# Patient Record
Sex: Female | Born: 1985 | Race: Black or African American | Hispanic: No | Marital: Single | Smoking: Never smoker
Health system: Southern US, Community
[De-identification: ages and names within clinical notes are randomized; demographics above are authoritative.]

## PROBLEM LIST (undated history)

## (undated) DIAGNOSIS — M419 Scoliosis, unspecified: Secondary | ICD-10-CM

## (undated) DIAGNOSIS — F419 Anxiety disorder, unspecified: Secondary | ICD-10-CM

## (undated) DIAGNOSIS — G43909 Migraine, unspecified, not intractable, without status migrainosus: Secondary | ICD-10-CM

## (undated) DIAGNOSIS — I471 Supraventricular tachycardia, unspecified: Secondary | ICD-10-CM

## (undated) HISTORY — DX: Migraine, unspecified, not intractable, without status migrainosus: G43.909

## (undated) HISTORY — DX: Scoliosis, unspecified: M41.9

---

## 2008-12-14 ENCOUNTER — Emergency Department (HOSPITAL_COMMUNITY): Admission: EM | Admit: 2008-12-14 | Discharge: 2008-12-14 | Payer: Self-pay | Admitting: Emergency Medicine

## 2014-01-02 ENCOUNTER — Encounter (HOSPITAL_BASED_OUTPATIENT_CLINIC_OR_DEPARTMENT_OTHER): Payer: Self-pay | Admitting: Emergency Medicine

## 2014-01-02 ENCOUNTER — Emergency Department (HOSPITAL_BASED_OUTPATIENT_CLINIC_OR_DEPARTMENT_OTHER)
Admission: EM | Admit: 2014-01-02 | Discharge: 2014-01-02 | Disposition: A | Discharge status: Home / Self Care | Payer: BC Managed Care – PPO | Attending: Emergency Medicine | Admitting: Emergency Medicine

## 2014-01-02 ENCOUNTER — Emergency Department (HOSPITAL_BASED_OUTPATIENT_CLINIC_OR_DEPARTMENT_OTHER): Payer: BC Managed Care – PPO

## 2014-01-02 DIAGNOSIS — F419 Anxiety disorder, unspecified: Secondary | ICD-10-CM

## 2014-01-02 DIAGNOSIS — F411 Generalized anxiety disorder: Secondary | ICD-10-CM | POA: Insufficient documentation

## 2014-01-02 DIAGNOSIS — K2970 Gastritis, unspecified, without bleeding: Secondary | ICD-10-CM

## 2014-01-02 DIAGNOSIS — K2990 Gastroduodenitis, unspecified, without bleeding: Principal | ICD-10-CM

## 2014-01-02 DIAGNOSIS — Z8659 Personal history of other mental and behavioral disorders: Secondary | ICD-10-CM | POA: Insufficient documentation

## 2014-01-02 DIAGNOSIS — Z79899 Other long term (current) drug therapy: Secondary | ICD-10-CM | POA: Insufficient documentation

## 2014-01-02 DIAGNOSIS — Z3202 Encounter for pregnancy test, result negative: Secondary | ICD-10-CM | POA: Insufficient documentation

## 2014-01-02 HISTORY — DX: Supraventricular tachycardia, unspecified: I47.10

## 2014-01-02 HISTORY — DX: Supraventricular tachycardia: I47.1

## 2014-01-02 HISTORY — DX: Anxiety disorder, unspecified: F41.9

## 2014-01-02 LAB — COMPREHENSIVE METABOLIC PANEL
ALBUMIN: 3.9 g/dL (ref 3.5–5.2)
ALT: 9 U/L (ref 0–35)
AST: 20 U/L (ref 0–37)
Alkaline Phosphatase: 50 U/L (ref 39–117)
BILIRUBIN TOTAL: 0.3 mg/dL (ref 0.3–1.2)
BUN: 9 mg/dL (ref 6–23)
CO2: 21 mEq/L (ref 19–32)
CREATININE: 0.8 mg/dL (ref 0.50–1.10)
Calcium: 9 mg/dL (ref 8.4–10.5)
Chloride: 103 mEq/L (ref 96–112)
GFR calc Af Amer: >90 mL/min (ref >90)
GFR calc non Af Amer: >90 mL/min (ref >90)
Glucose, Bld: 109 mg/dL — ABNORMAL HIGH (ref 70–99)
Potassium: 3.3 mEq/L — ABNORMAL LOW (ref 3.7–5.3)
Sodium: 139 mEq/L (ref 137–147)
Total Protein: 7.7 g/dL (ref 6.0–8.3)

## 2014-01-02 LAB — CBC WITH DIFFERENTIAL/PLATELET
BASOS ABS: 0 10*3/uL (ref 0.0–0.1)
BASOS PCT: 0 % (ref 0–1)
EOS ABS: 0 10*3/uL (ref 0.0–0.7)
EOS PCT: 1 % (ref 0–5)
HEMATOCRIT: 34.5 % — AB (ref 36.0–46.0)
Hemoglobin: 11.3 g/dL — ABNORMAL LOW (ref 12.0–15.0)
LYMPHS PCT: 32 % (ref 12–46)
Lymphs Abs: 1.9 10*3/uL (ref 0.7–4.0)
MCH: 25.3 pg — ABNORMAL LOW (ref 26.0–34.0)
MCHC: 32.8 g/dL (ref 30.0–36.0)
MCV: 77.4 fL — AB (ref 78.0–100.0)
MONO ABS: 0.6 10*3/uL (ref 0.1–1.0)
Monocytes Relative: 9 % (ref 3–12)
Neutro Abs: 3.4 10*3/uL (ref 1.7–7.7)
Neutrophils Relative %: 58 % (ref 43–77)
PLATELETS: 290 10*3/uL (ref 150–400)
RBC: 4.46 MIL/uL (ref 3.87–5.11)
RDW: 16 % — AB (ref 11.5–15.5)
WBC: 5.9 10*3/uL (ref 4.0–10.5)

## 2014-01-02 LAB — LIPASE, BLOOD: LIPASE: 70 U/L — AB (ref 11–59)

## 2014-01-02 LAB — PREGNANCY, URINE: Preg Test, Ur: NEGATIVE

## 2014-01-02 LAB — D-DIMER, QUANTITATIVE (NOT AT ARMC): D-Dimer, Quant: <0.27 ug/mL-FEU (ref 0.00–0.48)

## 2014-01-02 MED ORDER — GI COCKTAIL ~~LOC~~
30.0000 mL | Freq: Once | ORAL | Status: AC
  Administered 2014-01-02: 30 mL via ORAL
  Filled 2014-01-02: qty 30

## 2014-01-02 MED ORDER — SUCRALFATE 1 GM/10ML PO SUSP: 1.0000 g | Freq: Three times a day (TID) | ORAL | Status: AC

## 2014-01-02 NOTE — ED Notes (Signed)
Pt advised of need for urine specimen collection and verbalized understanding

## 2014-01-02 NOTE — Discharge Instructions (Signed)

## 2014-01-02 NOTE — ED Notes (Signed)
Pt reports light head since Thursday. Start of chest tightness, left arm pain and return of light headedness Saturday, onset of SOB this AM .

## 2014-01-02 NOTE — ED Provider Notes (Signed)
CSN: [id number]     Arrival date & time 01/02/14  0256 History   First MD Initiated Contact with Patient 01/02/14 0315     Chief Complaint  Patient presents with  . Nausea    chest tiughtness and arm pain     (Consider location/radiation/quality/duration/timing/severity/associated sxs/prior Treatment) Patient is a 28 y.o. female presenting with abdominal pain. The history is provided by the patient.  Abdominal Pain Pain location:  Epigastric Pain quality: sharp   Pain radiates to:  Back Pain severity:  Moderate Onset quality:  Sudden Timing:  Constant Progression:  Unchanged Context: alcohol use and eating   Context comment:  Six glasses of wine a steak and ceasar salad Relieved by:  Nothing Worsened by:  Nothing tried Ineffective treatments:  None tried Associated symptoms: nausea   Associated symptoms: no anorexia, no fever and no vomiting   Risk factors: not pregnant   Ate a large meal with wine and then had pain.  Then became anxious about symptoms and things became worse and sought evaluation  Past Medical History  Diagnosis Date  . PSVT (paroxysmal supraventricular tachycardia)   . Anxiety    History reviewed. No pertinent past surgical history. History reviewed. No pertinent family history. History  Substance Use Topics  . Smoking status: Never Smoker   . Smokeless tobacco: Not on file  . Alcohol Use: Yes   OB History   Grav Para Term Preterm Abortions TAB SAB Ect Mult Living                 Review of Systems  Constitutional: Negative for fever.  Cardiovascular: Negative for palpitations and leg swelling.  Gastrointestinal: Positive for nausea and abdominal pain. Negative for vomiting and anorexia.  All other systems reviewed and are negative.      Allergies  Penicillins  Home Medications   Current Outpatient Rx  Name  Route  Sig  Dispense  Refill  . metoprolol tartrate (LOPRESSOR) 25 MG tablet   Oral   Take 12.5 mg by mouth 2 (two) times  daily.         . sucralfate (CARAFATE) 1 GM/10ML suspension   Oral   Take 10 mLs (1 g total) by mouth 4 (four) times daily -  with meals and at bedtime.   420 mL   0    BP 139/96  Pulse 108  Temp(Src) 98.3 F (36.8 C) (Oral)  Ht 5\' 4"  (1.626 m)  Wt 128 lb (58.06 kg)  BMI 21.96 kg/m2  SpO2 100% Physical Exam  Constitutional: She is oriented to person, place, and time. She appears well-developed and well-nourished. No distress.  HENT:  Head: Normocephalic and atraumatic.  Mouth/Throat: Oropharynx is clear and moist.  Eyes: Conjunctivae are normal. Pupils are equal, round, and reactive to light.  Neck: Normal range of motion. Neck supple.  Cardiovascular: Normal rate, regular rhythm and intact distal pulses.   Pulmonary/Chest: Effort normal and breath sounds normal. She has no wheezes. She has no rales.  Abdominal: Soft. Bowel sounds are normal. There is no tenderness. There is no rebound and no guarding.  Musculoskeletal: Normal range of motion. She exhibits no edema.  Neurological: She is alert and oriented to person, place, and time. She has normal reflexes.  Skin: Skin is warm and dry. She is not diaphoretic.  Psychiatric: Her mood appears anxious.    ED Course  Procedures (including critical care time) Labs Review Labs Reviewed  CBC WITH DIFFERENTIAL - Abnormal; Notable for the following:  Hemoglobin 11.3 (*)    HCT 34.5 (*)    MCV 77.4 (*)    MCH 25.3 (*)    RDW 16.0 (*)    All other components within normal limits  COMPREHENSIVE METABOLIC PANEL - Abnormal; Notable for the following:    Potassium 3.3 (*)    Glucose, Bld 109 (*)    All other components within normal limits  LIPASE, BLOOD - Abnormal; Notable for the following:    Lipase 70 (*)    All other components within normal limits  PREGNANCY, URINE  D-DIMER, QUANTITATIVE   Imaging Review Dg Chest 2 View  01/02/2014   CLINICAL DATA:  Nausea  EXAM: CHEST  2 VIEW  COMPARISON:  None.  FINDINGS: The  heart size and mediastinal contours are within normal limits. Both lungs are clear. The visualized skeletal structures are unremarkable.  IMPRESSION: No active cardiopulmonary disease.   Electronically Signed   By: Marlan Palauharles  Clark M.D.   On: 01/02/2014 04:45     EKG Interpretation   Date/Time:  Sunday January 02 2014 03:11:16 EDT Ventricular Rate:  104 PR Interval:  124 QRS Duration: 86 QT Interval:  330 QTC Calculation: 433 R Axis:   80 Text Interpretation:  Sinus tachycardia Confirmed by [facility]  MD,  Ayerim Berquist ([id number]) on 01/02/2014 3:17:52 AM      MDM   Final diagnoses:  Gastritis  Anxiety    Highly doubt cardiac etiology, DDimer is negative excluding blood clot in a very low risk patient.  The patient has a h/o of SVT and anxiety that she was on metoprolol for but she has not taken it recently.  Suspect gastritis and superimposed anxiety.  D/c alcohol and follow up with your doctor for ongoing care    Nao Linz K Allora Bains-Rasch, MD 01/02/14 [phone]

## 2015-10-02 ENCOUNTER — Encounter: Payer: Self-pay | Admitting: Allergy and Immunology

## 2015-10-02 ENCOUNTER — Ambulatory Visit (INDEPENDENT_AMBULATORY_CARE_PROVIDER_SITE_OTHER): Payer: BLUE CROSS/BLUE SHIELD | Admitting: Allergy and Immunology

## 2015-10-02 VITALS — BP 128/84 | HR 90 | Temp 98.4°F | Resp 20 | Ht 64.5 in | Wt 153.4 lb

## 2015-10-02 DIAGNOSIS — J3089 Other allergic rhinitis: Secondary | ICD-10-CM | POA: Diagnosis not present

## 2015-10-02 DIAGNOSIS — T783XXA Angioneurotic edema, initial encounter: Secondary | ICD-10-CM

## 2015-10-02 DIAGNOSIS — T7840XA Allergy, unspecified, initial encounter: Secondary | ICD-10-CM | POA: Diagnosis not present

## 2015-10-02 MED ORDER — EPINEPHRINE 0.3 MG/0.3ML IJ SOAJ: 0.3000 mg | Freq: Once | INTRAMUSCULAR | Status: AC

## 2015-10-02 NOTE — Assessment & Plan Note (Signed)
   Continue fexofenadine 180 mg daily as needed.  If symptoms persist or progress, we will evaluate with aero allergen skin testing and make further recommendations based upon those results.

## 2015-10-02 NOTE — Patient Instructions (Signed)
Allergic reactions The patient's history suggests allergic reaction to herbal ingredients in the supplements she had taken, though there are no specific constituents which seem to be commented both supplements.  Should symptoms recur, a journal is to be kept recording any foods eaten, beverages consumed, and medications taken within a 6 hour time period prior to the onset of symptoms, as well as record activities being performed, and environmental conditions. For any symptoms concerning for anaphylaxis, epinephrine is to be administered and 911 is to be called immediately.  I have recommended using FDA approved prescribed medications rather than herbal supplements for now.  If symptoms recur in the absence of an herbal supplement for other specific trigger, we will evaluate further with labs.  Other allergic rhinitis  Continue fexofenadine 180 mg daily as needed.  If symptoms persist or progress, we will evaluate with aero allergen skin testing and make further recommendations based upon those results.   Return in about 4 months (around 01/31/2016), or if symptoms worsen or fail to improve.

## 2015-10-02 NOTE — Assessment & Plan Note (Addendum)
The patient's history suggests allergic reaction to herbal supplements she had taken, though there are no specific constituents which are common to both supplements.  Should symptoms recur, a journal is to be kept recording any foods eaten, beverages consumed, and medications taken within a 6 hour time period prior to the onset of symptoms, as well as record activities being performed, and environmental conditions. For any symptoms concerning for anaphylaxis, epinephrine is to be administered and 911 is to be called immediately.  I have recommended using FDA approved medications prescribed by a licensed medical professional rather than herbal supplements for now.  If symptoms recur in the absence of an herbal supplement for other specific trigger, we will evaluate further with lab work.

## 2015-10-02 NOTE — Progress Notes (Signed)
NEW PATIENT NOTE  RE: Nicole Esparza MRN: [id number] DOB: 06-Apr-1986 [facility] [address] [place] [address] Date of Office Visit: 10/02/2015  Referring provider: No referring provider defined for this encounter.  Chief Complaint: Allergic Reaction; Food Allergy; Facial Swelling; Breathing Problem; and Allergic Rhinitis    History of present illness: HPI Comments: Nicole Esparza is a 29 y.o. female who presents today for her initial evaluation of allergic reactions. She reports that on April 27th 2016, she took her first dose of an over the counter weight loss herbal supplement. Within 2 hours she developed eyelid and facial angioedema. She was treated at the local urgent care with systemic steroids and her symptoms resolved within a few hours. She discontinued this supplement and had no recurrence of symptoms until May 9th, 2016. On May 9th, she took her first dose of an over the counter antihypertensive herbal supplement and within an hour she once again developed eyelid and facial angioedema. She did not experience concomitant cardiopulmonary or GI symptoms. No common ingredients were found in the 2 supplements.  She experiences nasal congestion, runny nose, and sneezing. These symptoms are most severe in the springtime.   Assessment and plan: No problem-specific assessment & plan notes found for this encounter.   Medications ordered this encounter: No orders of the defined types were placed in this encounter.    Diagnositics: Food allergen skin testing: Negative despite a positive histamine control.    Physical examination: Blood pressure 128/84, pulse 90, temperature 98.4 F (36.9 C), resp. rate 24, height 5' 4.5" (1.638 m), weight 153 lb 6.4 oz (69.582 kg).  General: Alert, interactive, in no acute distress. HEENT: TMs pearly gray, turbinates moderately edematous without discharge, post-pharynx mildly  erythematous. Neck: Supple without lymphadenopathy. Lungs: Clear to auscultation without wheezing, rhonchi or rales. CV: Normal S1, S2 without murmurs. Abdomen: Nondistended, nontender. Skin: Warm and dry, without lesions or rashes. Extremities:  No clubbing, cyanosis or edema. Neuro:   Grossly intact.  Review of systems: Review of Systems  Constitutional: Negative for fever, chills and weight loss.  HENT: Positive for congestion. Negative for nosebleeds.   Eyes: Negative for blurred vision.  Respiratory: Negative for hemoptysis, shortness of breath and wheezing.   Cardiovascular: Negative for chest pain.  Gastrointestinal: Negative for diarrhea and constipation.  Genitourinary: Negative for dysuria.  Musculoskeletal: Negative for myalgias and joint pain.  Neurological: Negative for dizziness.  Endo/Heme/Allergies: Does not bruise/bleed easily.    Past medical history: Past Medical History  Diagnosis Date  . PSVT (paroxysmal supraventricular tachycardia) (HCC)   . Anxiety   . PSVT (paroxysmal supraventricular tachycardia) (HCC) 29 years ago  . Migraines   . Scoliosis age 40    Past surgical history: History reviewed. No pertinent past surgical history.  Family history: Family History  Problem Relation Age of Onset  . Hypertension Mother   . Allergic rhinitis Mother   . Hypertension Father   . Allergic rhinitis Father   . Hypertension Sister   . Allergic rhinitis Sister   . Allergic rhinitis Brother   . Asthma Maternal Grandmother   . Asthma Paternal Grandmother     Social history: Social History   Social History  . Marital Status: Single    Spouse Name: N/A  . Number of Children: N/A  . Years of Education: N/A   Occupational History  . Not on file.   Social History Main Topics  .  Smoking status: Never Smoker   . Smokeless tobacco: Never Used  . Alcohol Use: Yes  . Drug Use: No  . Sexual Activity: Yes    Birth Control/ Protection: Pill   Other  Topics Concern  . Not on file   Social History Narrative   Environmental History:  Zella BallRobin lives in a 29 year old apartment with carpeting throughout and central air/heat.  She is a nonsmoker without pets.  Known medication allergies: Allergies  Allergen Reactions  . Penicillins Hives  . Sulfamethoxazole-Trimethoprim Hives  . Molds & Smuts Other (See Comments)    Sneezing, wheezing, feels like throat closing up    Outpatient medications:   Medication List       This list is accurate as of: 10/02/15  9:16 AM.  Always use your most recent med list.               ferrous sulfate 325 (65 FE) MG tablet  Take 325 mg by mouth daily as needed.     metoprolol tartrate 25 MG tablet  Commonly known as:  LOPRESSOR  Take 12.5 mg by mouth daily.     norgestimate-ethinyl estradiol 0.25-35 MG-MCG tablet  Commonly known as:  ORTHO-CYCLEN,SPRINTEC,PREVIFEM  Take 1 tablet by mouth daily.     sucralfate 1 GM/10ML suspension  Commonly known as:  CARAFATE  Take 10 mLs (1 g total) by mouth 4 (four) times daily -  with meals and at bedtime.     Vitamin D (Ergocalciferol) 50000 UNITS Caps capsule  Commonly known as:  DRISDOL  Take by mouth.        I appreciate the opportunity to take part in this Malan's care. Please do not hesitate to contact me with questions.  Sincerely,   R. Jorene Guestarter Manson [place]oo, MD

## 2015-10-05 IMAGING — CR DG CHEST 2V
2 series · 2 of 2 positions shown · non-contrast
Comparison: None.

CLINICAL DATA: Nausea

EXAM:
CHEST  2 VIEW

[w chest pa]
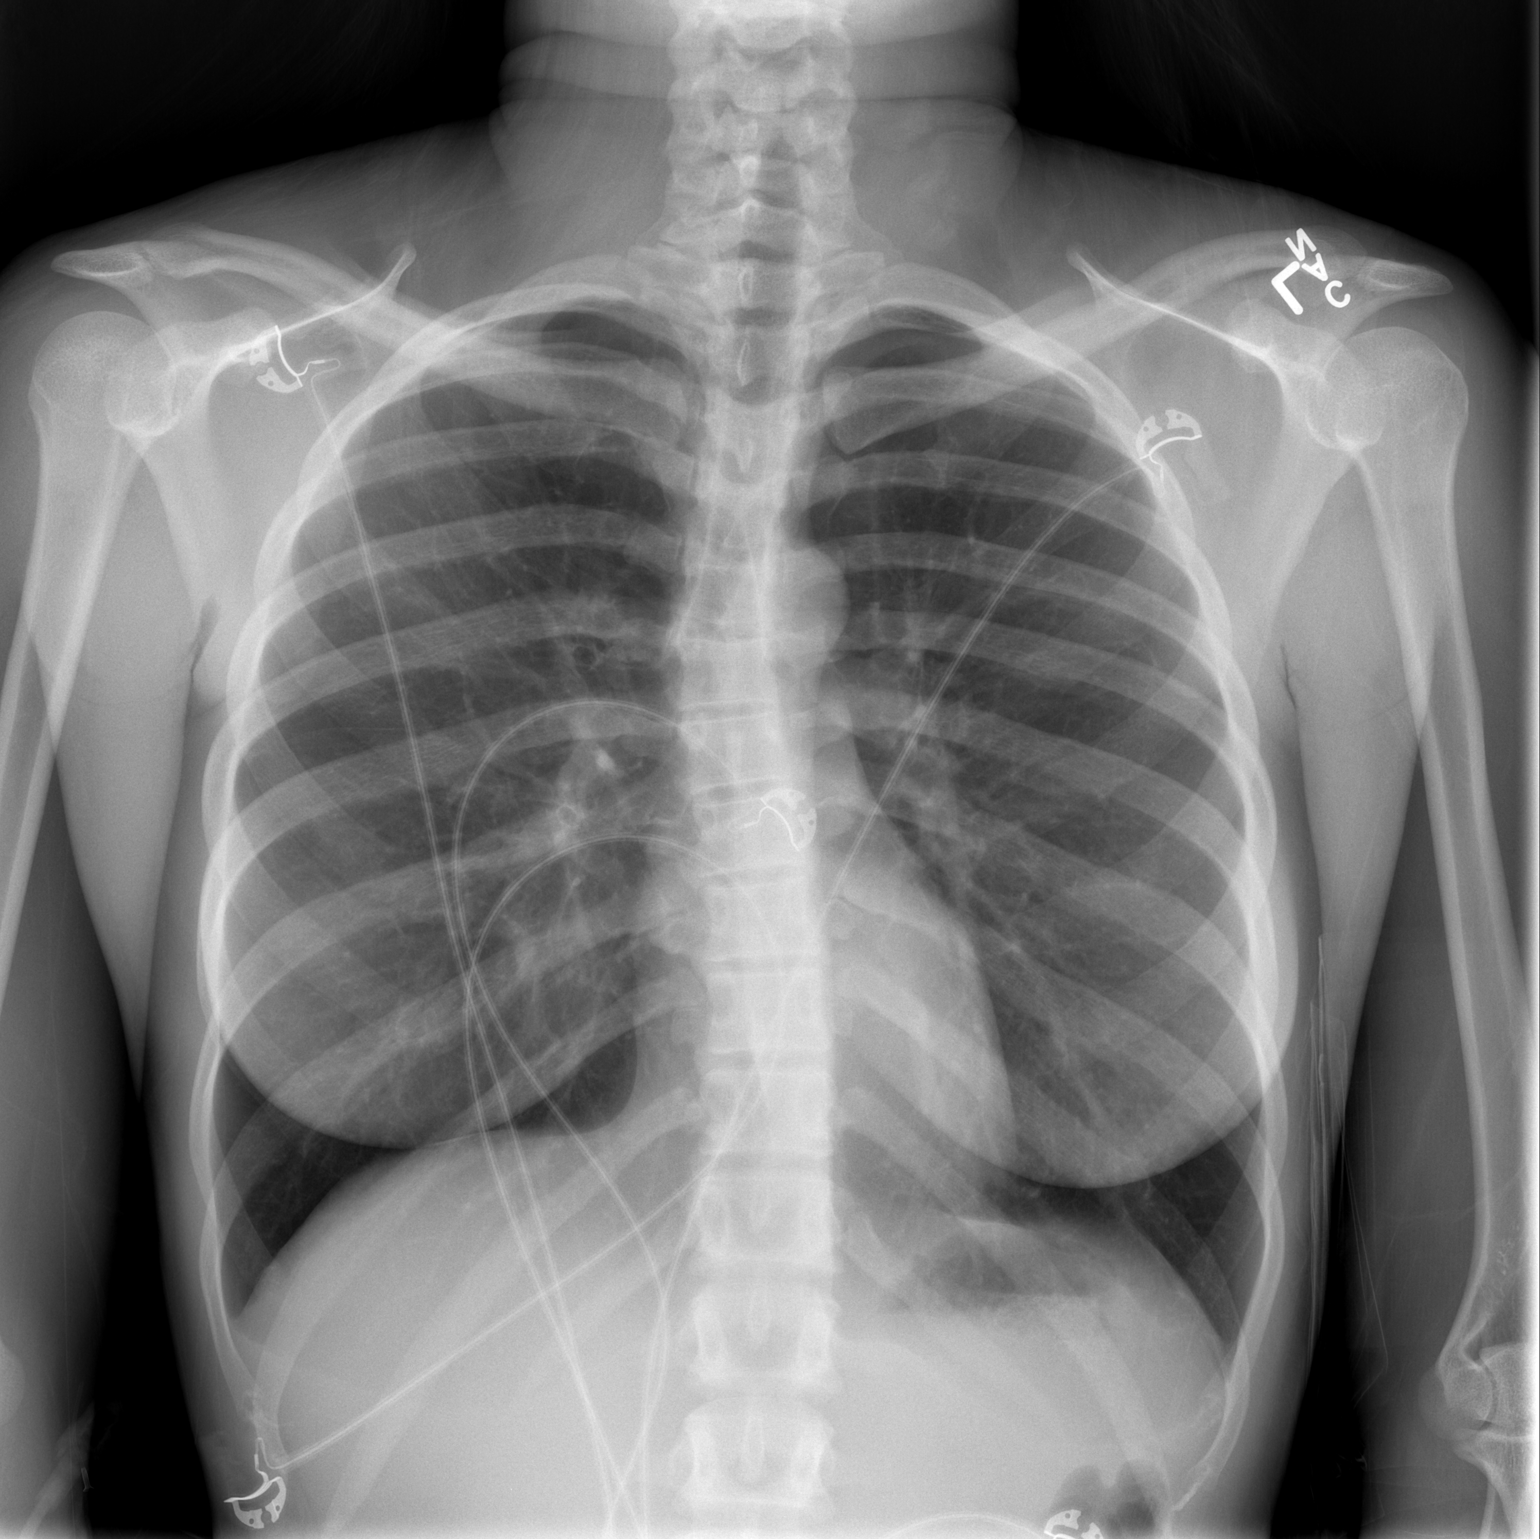

[w chest lat]
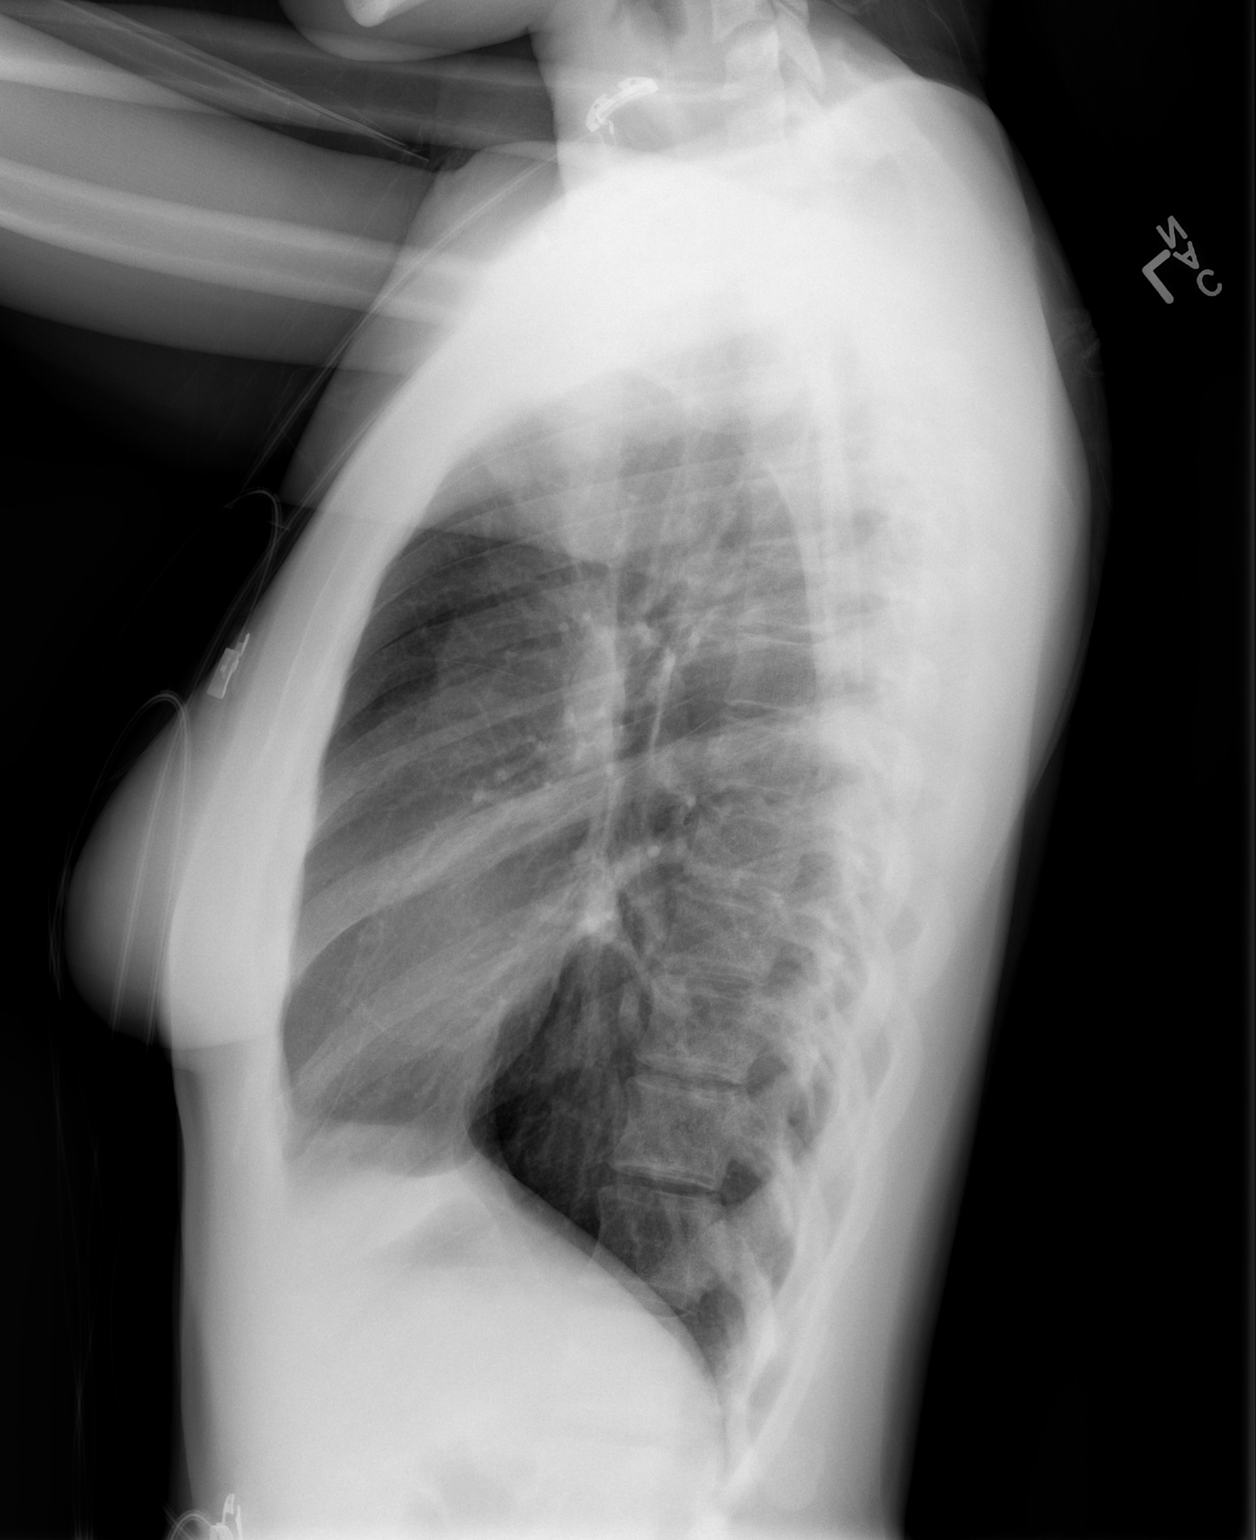

[2 of 2 positions shown; findings below may reference images not displayed]

FINDINGS: The heart size and mediastinal contours are within normal limits.
Both lungs are clear. The visualized skeletal structures are
unremarkable.
IMPRESSION: No active cardiopulmonary disease.

## 2024-06-09 ENCOUNTER — Other Ambulatory Visit: Payer: Self-pay

## 2024-06-09 ENCOUNTER — Encounter (HOSPITAL_BASED_OUTPATIENT_CLINIC_OR_DEPARTMENT_OTHER): Payer: Self-pay | Admitting: Emergency Medicine

## 2024-06-09 ENCOUNTER — Emergency Department (HOSPITAL_BASED_OUTPATIENT_CLINIC_OR_DEPARTMENT_OTHER): Payer: Self-pay

## 2024-06-09 DIAGNOSIS — R030 Elevated blood-pressure reading, without diagnosis of hypertension: Secondary | ICD-10-CM | POA: Diagnosis present

## 2024-06-09 DIAGNOSIS — M79602 Pain in left arm: Secondary | ICD-10-CM | POA: Insufficient documentation

## 2024-06-09 NOTE — ED Triage Notes (Addendum)
 Pt c/o shooting L arm pain x 30 min. Denies known injury, swelling to extremity.   Reports chest pressure when pain initially began.

## 2024-06-10 ENCOUNTER — Emergency Department (HOSPITAL_BASED_OUTPATIENT_CLINIC_OR_DEPARTMENT_OTHER)
Admission: EM | Admit: 2024-06-10 | Discharge: 2024-06-10 | Disposition: A | Discharge status: Home / Self Care | Payer: Self-pay | Attending: Emergency Medicine | Admitting: Emergency Medicine

## 2024-06-10 DIAGNOSIS — M79602 Pain in left arm: Secondary | ICD-10-CM

## 2024-06-10 DIAGNOSIS — I160 Hypertensive urgency: Secondary | ICD-10-CM

## 2024-06-10 LAB — CBC
HCT: 35.6 % — ABNORMAL LOW (ref 36.0–46.0)
Hemoglobin: 11.1 g/dL — ABNORMAL LOW (ref 12.0–15.0)
MCH: 24.6 pg — ABNORMAL LOW (ref 26.0–34.0)
MCHC: 31.2 g/dL (ref 30.0–36.0)
MCV: 78.8 fL — ABNORMAL LOW (ref 80.0–100.0)
Platelets: 317 K/uL (ref 150–400)
RBC: 4.52 MIL/uL (ref 3.87–5.11)
RDW: 17.7 % — ABNORMAL HIGH (ref 11.5–15.5)
WBC: 5.8 K/uL (ref 4.0–10.5)
nRBC: 0 % (ref 0.0–0.2)

## 2024-06-10 LAB — BASIC METABOLIC PANEL WITH GFR
Anion gap: 12 (ref 5–15)
BUN: 10 mg/dL (ref 6–20)
CO2: 21 mmol/L — ABNORMAL LOW (ref 22–32)
Calcium: 9.3 mg/dL (ref 8.9–10.3)
Chloride: 105 mmol/L (ref 98–111)
Creatinine, Ser: 0.95 mg/dL (ref 0.44–1.00)
GFR, Estimated: >60 mL/min (ref >60)
Glucose, Bld: 99 mg/dL (ref 70–99)
Potassium: 3.6 mmol/L (ref 3.5–5.1)
Sodium: 138 mmol/L (ref 135–145)

## 2024-06-10 LAB — TROPONIN T, HIGH SENSITIVITY: Troponin T High Sensitivity: <15 ng/L (ref 0–19)

## 2024-06-10 LAB — PREGNANCY, URINE: Preg Test, Ur: NEGATIVE

## 2024-06-10 MED ORDER — HYDROCODONE-ACETAMINOPHEN 5-325 MG PO TABS
2.0000 | ORAL_TABLET | Freq: Once | ORAL | Status: AC
  Administered 2024-06-10: 2 via ORAL
  Filled 2024-06-10: qty 2

## 2024-06-10 NOTE — ED Provider Notes (Signed)
 [place] [facility] Provider Note   CSN: [id number] Arrival date & time: 06/09/24  2200     Patient presents with: Arm Pain   Nicole Esparza is a 38 y.o. female.   Patient is a 38 year old female presenting with complaints of left arm pain and elevated blood pressure.  She has noticed her blood pressures running in the 150-160 range for the past several weeks, then this evening began having shooting pains down the back of her left arm.  She denies any shortness of breath, chest pain, fevers, or chills.  This evening, her blood pressure was as high as 180 and is concerned she may be having a hypertensive crisis.  Patient not currently on any antihypertensive medications.       Prior to Admission medications   Medication Sig Start Date End Date Taking? Authorizing Provider  EPINEPHrine  (EPIPEN  2-PAK) 0.3 mg/0.3 mL IJ SOAJ injection Inject 0.3 mLs (0.3 mg total) into the muscle once. 10/02/15   Bobbitt, Elgin Pepper, MD  ferrous sulfate 325 (65 FE) MG tablet Take 325 mg by mouth daily as needed. 07/13/15   [provider]  metoprolol tartrate (LOPRESSOR) 25 MG tablet Take 12.5 mg by mouth daily. 09/14/15   [provider]  norgestimate-ethinyl estradiol (ORTHO-CYCLEN,SPRINTEC,PREVIFEM) 0.25-35 MG-MCG tablet Take 1 tablet by mouth daily.    [provider]  sucralfate  (CARAFATE ) 1 GM/10ML suspension Take 10 mLs (1 g total) by mouth 4 (four) times daily -  with meals and at bedtime. Patient not taking: Reported on 10/02/2015 01/02/14   Palumbo, April, MD  Vitamin D, Ergocalciferol, (DRISDOL) 50000 UNITS CAPS capsule Take by mouth. 03/14/15   [provider]    Allergies: Penicillins, Sulfamethoxazole-trimethoprim, and Molds & smuts    Review of Systems  All other systems reviewed and are negative.   Updated Vital Signs BP (!) 147/103   Pulse 90   Temp 98.4 F (36.9 C)   Resp (!) 22   Ht 5' 4.5 (1.638 m)   Wt  78 kg   LMP 05/25/2024 (Approximate)   SpO2 100%   BMI 29.07 kg/m   Physical Exam Vitals and nursing note reviewed.  Constitutional:      General: She is not in acute distress.    Appearance: She is well-developed. She is not diaphoretic.  HENT:     Head: Normocephalic and atraumatic.  Cardiovascular:     Rate and Rhythm: Normal rate and regular rhythm.     Heart sounds: No murmur heard.    No friction rub. No gallop.  Pulmonary:     Effort: Pulmonary effort is normal. No respiratory distress.     Breath sounds: Normal breath sounds. No wheezing.  Abdominal:     General: Bowel sounds are normal. There is no distension.     Palpations: Abdomen is soft.     Tenderness: There is no abdominal tenderness.  Musculoskeletal:        General: Normal range of motion.     Cervical back: Normal range of motion and neck supple.  Skin:    General: Skin is warm and dry.  Neurological:     General: No focal deficit present.     Mental Status: She is alert and oriented to person, place, and time.     (all labs ordered are listed, but only abnormal results are displayed) Labs Reviewed  BASIC METABOLIC PANEL WITH GFR  CBC  PREGNANCY, URINE  TROPONIN T, HIGH SENSITIVITY  TROPONIN  T, HIGH SENSITIVITY    EKG: None  Radiology: DG Chest 2 View Result Date: 06/09/2024 CLINICAL DATA:  Chest pain EXAM: CHEST - 2 VIEW COMPARISON:  01/02/2014 FINDINGS: The heart size and mediastinal contours are within normal limits. Both lungs are clear. The visualized skeletal structures are unremarkable. No pneumothorax. IMPRESSION: No active cardiopulmonary disease. Electronically Signed   By: Franky Crease M.D.   On: 06/09/2024 22:26     Procedures   Medications Ordered in the ED - No data to display                                  Medical Decision Making Amount and/or Complexity of Data Reviewed Labs: ordered. Radiology: ordered.   Patient is a 38 year old female presenting with complaints  of elevated blood pressure and left arm pain.  Patient arrives here with stable vital signs and is afebrile.  Initial blood pressure 161/91 and at the time of this dictation is 147/103.  Physical examination is unremarkable  Laboratory studies obtained including CBC, metabolic panel, troponin, and pregnancy test, all of which are unremarkable.  Chest x-ray is clear.  Patient given Vicodin for pain.  Because of her discomfort is unclear, but I suspect a musculoskeletal etiology.  Highly doubt a cardiac etiology or other emergent process.     Final diagnoses:  None    ED Discharge Orders     None          Geroldine Berg, MD 06/10/24 [phone]

## 2024-06-10 NOTE — Discharge Instructions (Signed)
 Take ibuprofen 600 mg every 6 hours as needed for pain.  Keep a record of your blood pressures at home and follow-up with your primary doctor to determine whether or not you need changes in your medications.
